# Patient Record
Sex: Female | Born: 1945 | Race: White | Hispanic: No | Marital: Married | Smoking: Never smoker
Health system: Southern US, Community
[De-identification: ages and names within clinical notes are randomized; demographics above are authoritative.]

## PROBLEM LIST (undated history)

## (undated) DIAGNOSIS — I10 Essential (primary) hypertension: Secondary | ICD-10-CM

## (undated) DIAGNOSIS — K219 Gastro-esophageal reflux disease without esophagitis: Secondary | ICD-10-CM

## (undated) DIAGNOSIS — G629 Polyneuropathy, unspecified: Secondary | ICD-10-CM

## (undated) DIAGNOSIS — E119 Type 2 diabetes mellitus without complications: Secondary | ICD-10-CM

## (undated) DIAGNOSIS — J8410 Pulmonary fibrosis, unspecified: Secondary | ICD-10-CM

## (undated) HISTORY — PX: CHOLECYSTECTOMY: SHX55

## (undated) HISTORY — PX: APPENDECTOMY: SHX54

## (undated) HISTORY — PX: LUNG BIOPSY: SHX232

## (undated) HISTORY — PX: TONSILLECTOMY: SUR1361

## (undated) HISTORY — PX: GASTRIC FUNDOPLICATION: SHX226

---

## 2001-04-02 ENCOUNTER — Ambulatory Visit (HOSPITAL_COMMUNITY): Admission: RE | Admit: 2001-04-02 | Discharge: 2001-04-02 | Payer: Self-pay | Admitting: Gastroenterology

## 2004-12-14 ENCOUNTER — Ambulatory Visit: Payer: Self-pay | Admitting: Gastroenterology

## 2004-12-16 ENCOUNTER — Ambulatory Visit (HOSPITAL_COMMUNITY): Admission: RE | Admit: 2004-12-16 | Discharge: 2004-12-16 | Payer: Self-pay | Admitting: Gastroenterology

## 2004-12-21 ENCOUNTER — Ambulatory Visit (HOSPITAL_COMMUNITY): Admission: RE | Admit: 2004-12-21 | Discharge: 2004-12-21 | Payer: Self-pay | Admitting: Gastroenterology

## 2004-12-27 ENCOUNTER — Ambulatory Visit: Payer: Self-pay | Admitting: Gastroenterology

## 2005-01-03 ENCOUNTER — Ambulatory Visit: Payer: Self-pay | Admitting: Gastroenterology

## 2005-08-04 IMAGING — RF DG UGI W/ KUB
19 of 24 series · 19 of 24 positions shown · non-contrast
Comparison: none

CLINICAL DATA: Abdominal pain and reflux.  The patient had remote gastric procedure which was reversed.
SINGLE-CONTRAST UPPER GI SERIES WITH KUB:
On the scout view, multiple surgical clips and staples are seen in the upper abdomen, left greater than right.  Prominent degenerative changes throughout the lumbar spine with disc space narrowing at L3-4 and L4-5.  
Initially, there was a normal primary esophageal stripping wave.  There is distortion of the gastric fundus region with small sliding-type hiatal hernia.  The distortion may be related to patient?s prior surgery without discrete ulceration separate from such.  Initially, reflux was not demonstrated.  However, when the patient changed position, there was significant spontaneous gastroesophageal reflux reaching the level of the upper thoracic esophagus.  It was somewhat difficult to obtain adequately distended views of the remainder of the stomach but without ulceration identified.  Duodenal bulb is of normal configuration without ulceration.  Proximally visualized small bowel unremarkable.

[Series 1: run · 1 of 9 slices shown (1 of 19)]
[im 1/9]
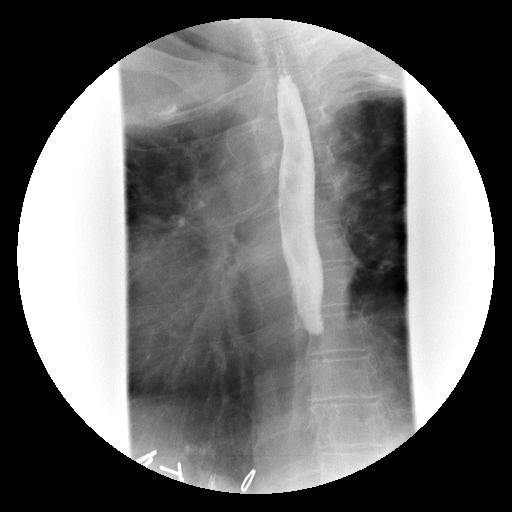

[Series 2: run · 1 of 9 slices shown (2 of 19)]
[im 1/9]
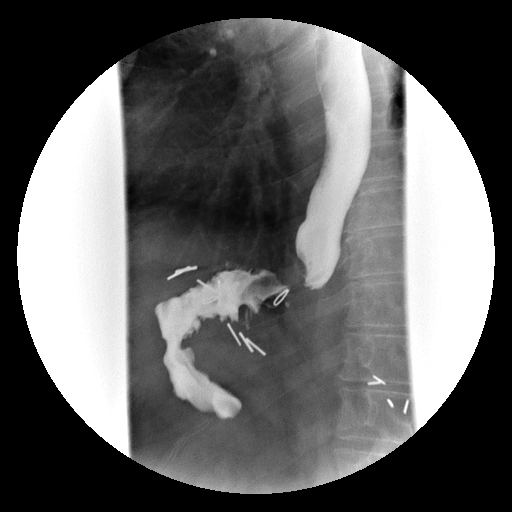

[Series 4: run · 1 of 1 slices shown (3 of 19)]
[im 1/1]
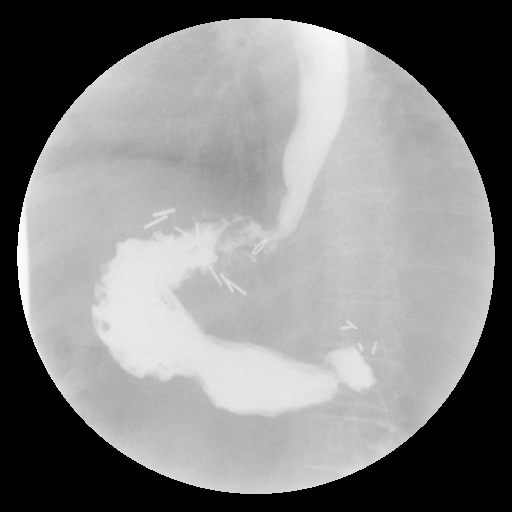

[Series 5: run · 1 of 1 slices shown (4 of 19)]
[im 1/1]
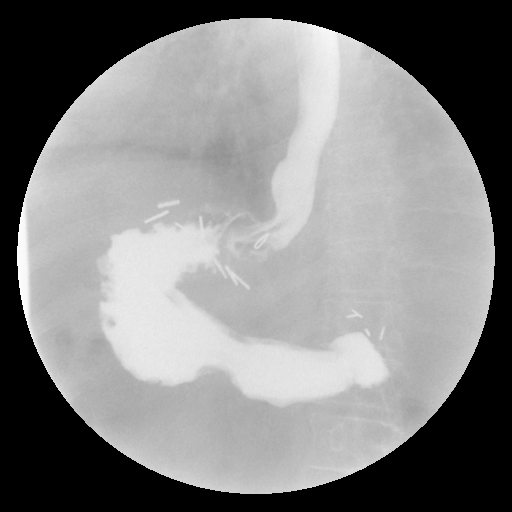

[Series 6: run · 1 of 1 slices shown (5 of 19)]
[im 1/1]
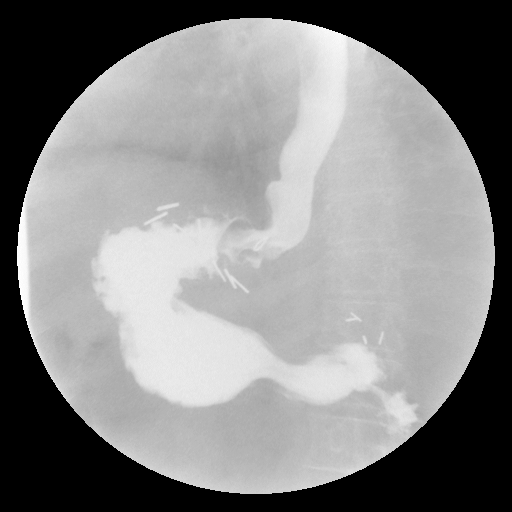

[Series 7: run · 1 of 1 slices shown (6 of 19)]
[im 1/1]
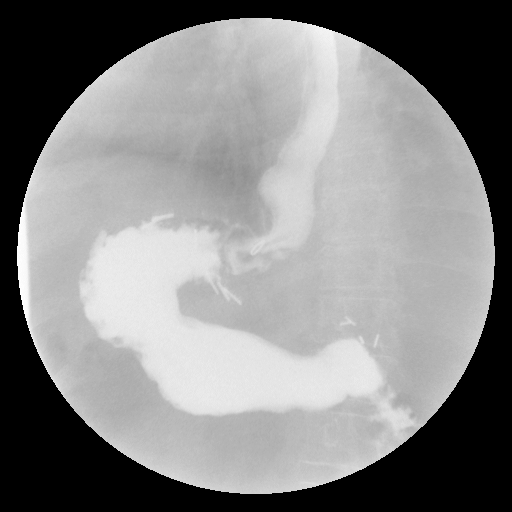

[Series 9: run · 1 of 1 slices shown (7 of 19)]
[im 1/1]
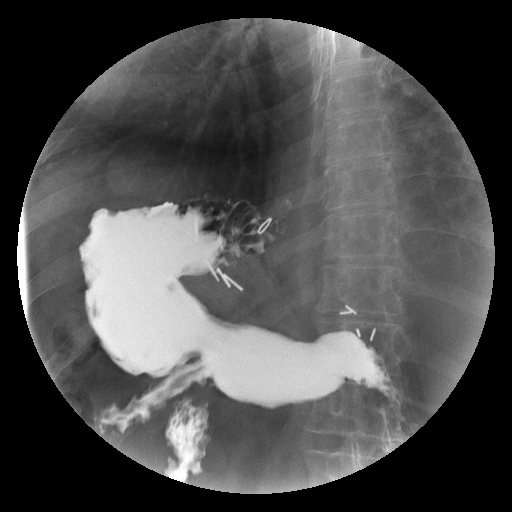

[Series 10: run · 1 of 1 slices shown (8 of 19)]
[im 1/1]
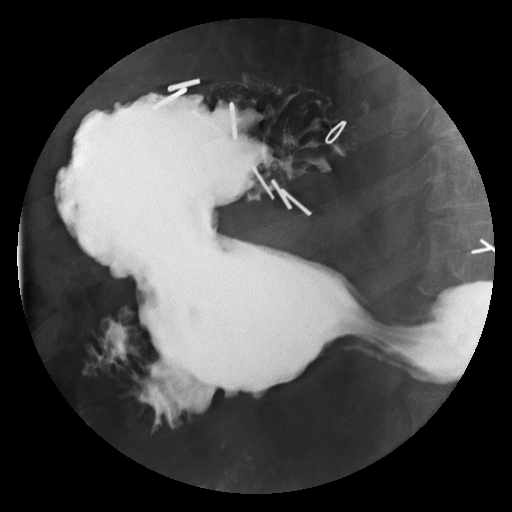

[Series 11: run · 1 of 1 slices shown (9 of 19)]
[im 1/1]
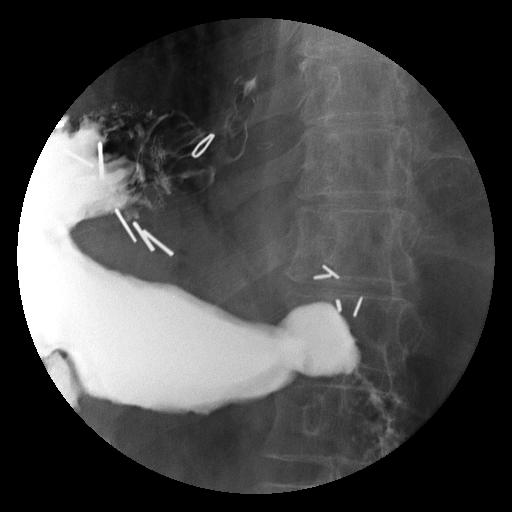

[Series 13: run · 1 of 3 slices shown (10 of 19)]
[im 1/3]
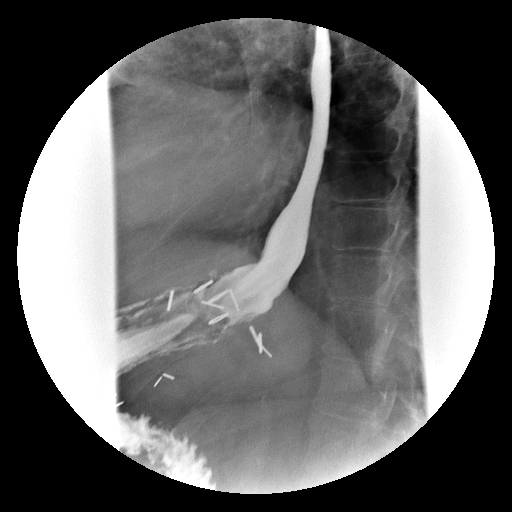

[Series 14: run · 1 of 1 slices shown (11 of 19)]
[im 1/1]
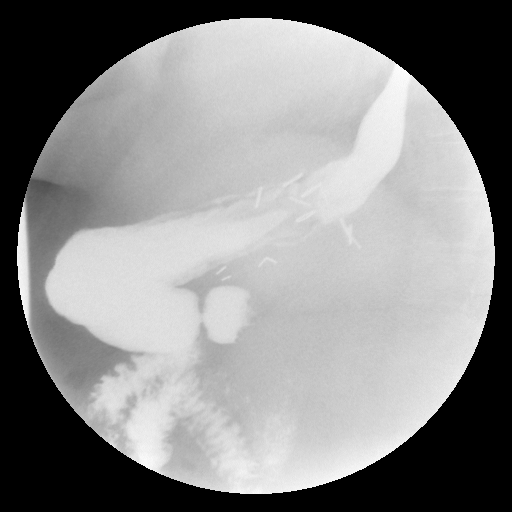

[Series 15: run · 1 of 1 slices shown (12 of 19)]
[im 1/1]
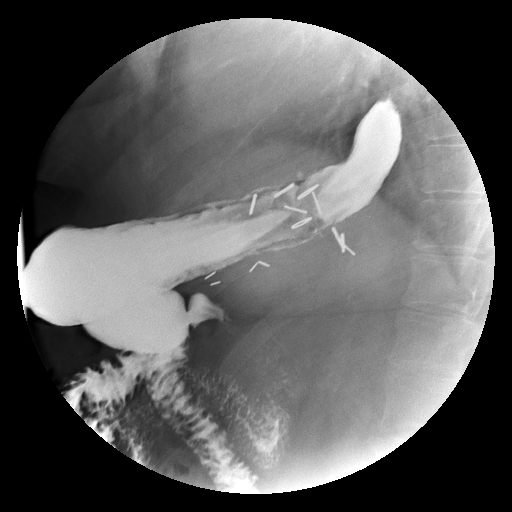

[Series 16: run · 1 of 1 slices shown (13 of 19)]
[im 1/1]
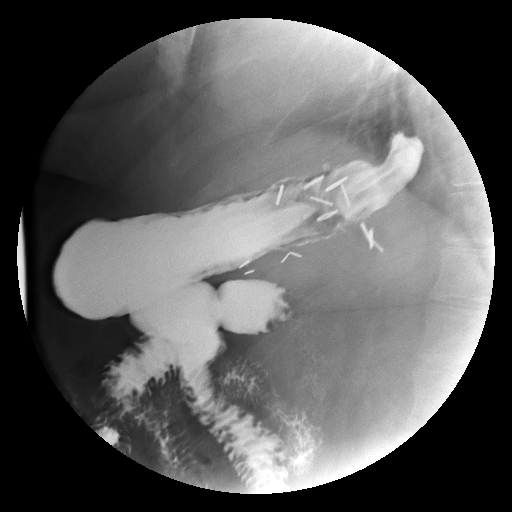

[Series 18: run · 1 of 1 slices shown (14 of 19)]
[im 1/1]
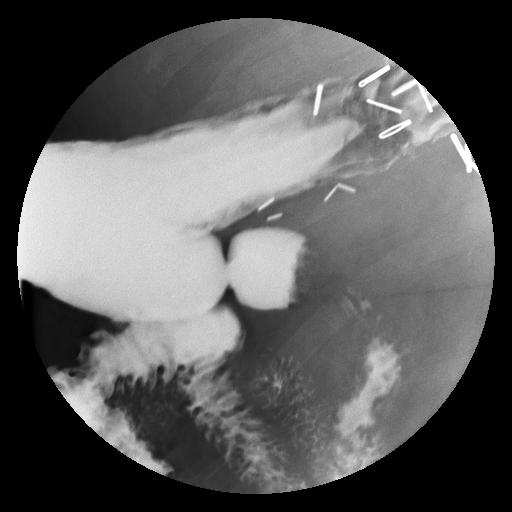

[Series 19: run · 1 of 1 slices shown (15 of 19)]
[im 1/1]
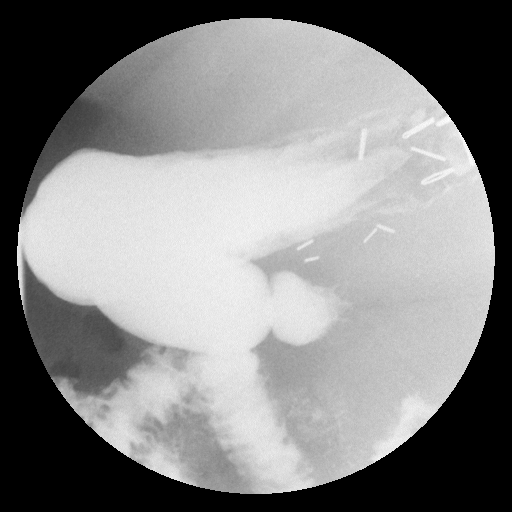

[Series 20: run · 1 of 1 slices shown (16 of 19)]
[im 1/1]
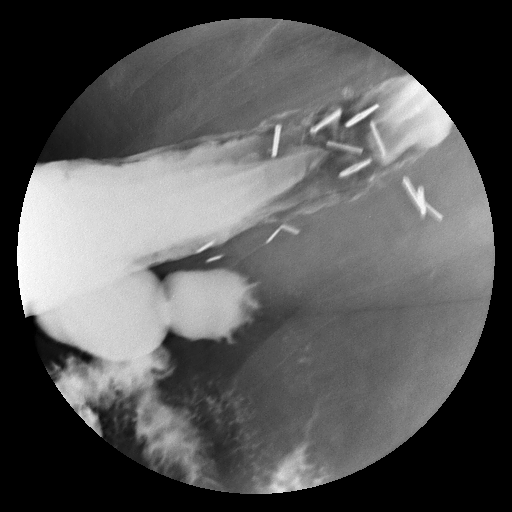

[Series 21: run · 1 of 1 slices shown (17 of 19)]
[im 1/1]
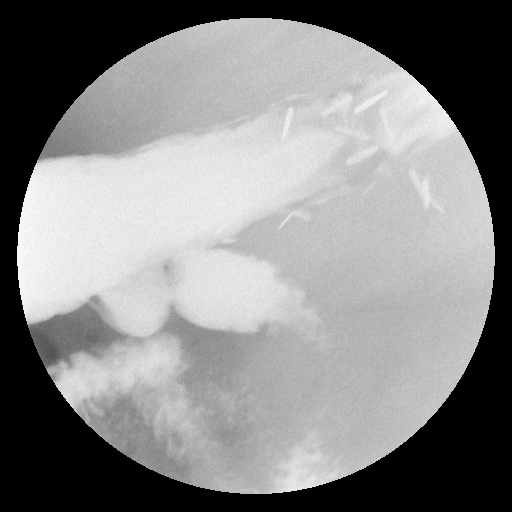

[Series 23: run · 1 of 1 slices shown (18 of 19)]
[im 1/1]
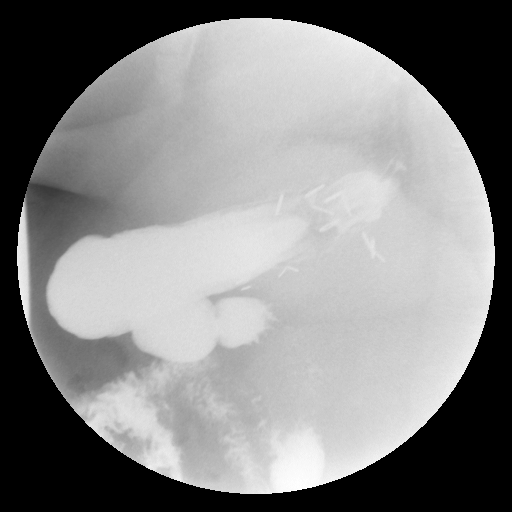

[Series 24: run · 1 of 1 slices shown (19 of 19)]
[im 1/1]
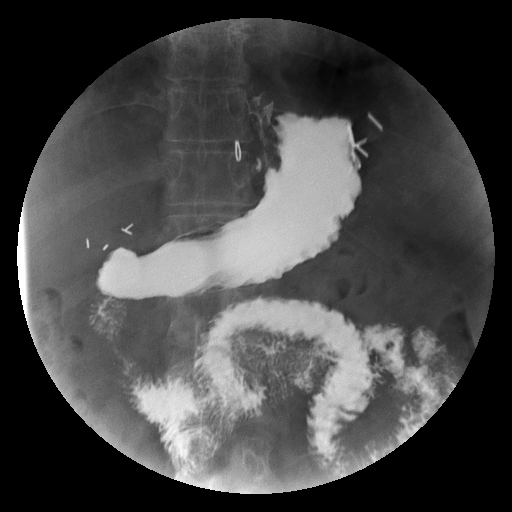

[19 of 24 positions shown; findings below may reference images not displayed]

IMPRESSION: Distortion of the gastric fundus may be related to result of prior surgery without definite ulcer separate from such.  If one needed to further evaluate this region, endoscopy would be necessary.  There is a residual small hiatal hernia and prominent spontaneous gastroesophageal reflux with change of patient position.  Please see above.

## 2006-08-07 ENCOUNTER — Ambulatory Visit: Payer: Self-pay | Admitting: Gastroenterology

## 2006-09-21 ENCOUNTER — Ambulatory Visit: Payer: Self-pay | Admitting: Gastroenterology

## 2013-10-28 ENCOUNTER — Encounter: Payer: Self-pay | Admitting: Emergency Medicine

## 2013-10-28 ENCOUNTER — Emergency Department (INDEPENDENT_AMBULATORY_CARE_PROVIDER_SITE_OTHER)
Admission: EM | Admit: 2013-10-28 | Discharge: 2013-10-28 | Disposition: A | Discharge status: Home / Self Care | Payer: Medicare Other | Source: Home / Self Care | Attending: Emergency Medicine | Admitting: Emergency Medicine

## 2013-10-28 DIAGNOSIS — R112 Nausea with vomiting, unspecified: Secondary | ICD-10-CM

## 2013-10-28 DIAGNOSIS — R197 Diarrhea, unspecified: Secondary | ICD-10-CM

## 2013-10-28 HISTORY — DX: Gastro-esophageal reflux disease without esophagitis: K21.9

## 2013-10-28 HISTORY — DX: Type 2 diabetes mellitus without complications: E11.9

## 2013-10-28 HISTORY — DX: Pulmonary fibrosis, unspecified: J84.10

## 2013-10-28 HISTORY — DX: Polyneuropathy, unspecified: G62.9

## 2013-10-28 HISTORY — DX: Essential (primary) hypertension: I10

## 2013-10-28 MED ORDER — PROMETHAZINE HCL 25 MG/ML IJ SOLN
25.0000 mg | INTRAMUSCULAR | Status: AC
  Administered 2013-10-28: 25 mg via INTRAMUSCULAR

## 2013-10-28 MED ORDER — ONDANSETRON 4 MG PO TBDP: 4.0000 mg | ORAL_TABLET | Freq: Three times a day (TID) | ORAL | Status: AC | PRN

## 2013-10-28 NOTE — ED Provider Notes (Signed)
CSN: [id number]     Arrival date & time 10/28/13  4098 History   First MD Initiated Contact with Patient 10/28/13 [phone]     Chief Complaint  Patient presents with  . Emesis  . Diarrhea     (Consider location/radiation/quality/duration/timing/severity/associated sxs/prior Treatment) Patient is a 68 y.o. female presenting with vomiting and diarrhea. The history is provided by the patient.  Emesis Severity:  Moderate Duration:  24 hours Timing:  Intermittent Emesis appearance: Clearish, minimally bile colored. Able to tolerate:  Liquids Progression:  Unchanged Chronicity:  New Recent urination:  Normal Context: not post-tussive and not self-induced   Relieved by:  Nothing Ineffective treatments:  None tried Associated symptoms: diarrhea (Several loose, watery diarrhea stools past 24 hours.--No blood in diarrhea. Possible mucous, she's not certain)   Associated symptoms: no abdominal pain, no chills, no cough, no fever (no definite fever, but she has not taken her temperature), no headaches, no myalgias, no sore throat and no URI   Risk factors: no sick contacts and no travel to endemic areas   Diarrhea Associated symptoms: vomiting   Associated symptoms: no abdominal pain, no chills, no recent cough, no headaches, no myalgias and no URI     Past Medical History  Diagnosis Date  . Hypertension   . Diabetes mellitus without complication   . Pulmonary fibrosis   . Neuropathy   . GERD (gastroesophageal reflux disease)    Past Surgical History  Procedure Laterality Date  . Gastric fundoplication    . Cholecystectomy    . Tonsillectomy    . Appendectomy    . Lung biopsy     Family History  Problem Relation Age of Onset  . Heart failure Mother   . Pulmonary fibrosis Mother   . Heart failure Brother   . Pulmonary fibrosis Brother    History  Substance Use Topics  . Smoking status: Never Smoker   . Smokeless tobacco: Not on file  . Alcohol Use: No   OB History   Grav  Para Term Preterm Abortions TAB SAB Ect Mult Living                 Review of Systems  Constitutional: Negative for chills.  HENT: Negative for sore throat.   Gastrointestinal: Positive for vomiting and diarrhea (Several loose, watery diarrhea stools past 24 hours.--No blood in diarrhea. Possible mucous, she's not certain). Negative for abdominal pain.  Musculoskeletal: Negative for myalgias.  Neurological: Negative for headaches.  All other systems reviewed and are negative.      Allergies  Demerol and Latex  Home Medications   Current Outpatient Rx  Name  Route  Sig  Dispense  Refill  . gabapentin (NEURONTIN) 100 MG capsule   Oral   Take 100 mg by mouth 3 (three) times daily.         Marland Kitchen lisinopril (PRINIVIL,ZESTRIL) 10 MG tablet   Oral   Take 10 mg by mouth daily.         . metoprolol succinate (TOPROL-XL) 25 MG 24 hr tablet   Oral   Take 25 mg by mouth daily.         Marland Kitchen omeprazole (PRILOSEC) 20 MG capsule   Oral   Take 20 mg by mouth daily.         Marland Kitchen PARoxetine (PAXIL) 20 MG tablet   Oral   Take 20 mg by mouth daily.         . ondansetron (ZOFRAN-ODT) 4 MG disintegrating tablet  Oral   Take 1 tablet (4 mg total) by mouth every 8 (eight) hours as needed for nausea.   15 tablet   0    BP 172/93  Pulse 81  Temp(Src) 98.3 F (36.8 C) (Oral)  Resp 18  Ht 5\' 2"  (1.575 m)  Wt 243 lb (110.224 kg)  BMI 44.43 kg/m2  SpO2 95% Physical Exam  Nursing note and vitals reviewed. Constitutional: She is oriented to person, place, and time. She appears well-developed and well-nourished.  Non-toxic appearance. No distress.  Appears fatigued, but no acute distress. Pleasant, cooperative.  HENT:  Head: Normocephalic and atraumatic.  Nose: Nose normal.  Mouth/Throat: Oropharynx is clear and moist.  Oropharynx clear. Some moisture on mucous membranes. No lesions.  Eyes: Pupils are equal, round, and reactive to light. No scleral icterus.  Neck: Normal range of  motion. Neck supple. No JVD present.  Cardiovascular: Normal rate, regular rhythm and normal heart sounds.   No murmur heard. Pulmonary/Chest: Effort normal and breath sounds normal.  Abdominal: Soft. She exhibits no distension, no abdominal bruit and no mass. Bowel sounds are increased. There is no hepatosplenomegaly. There is tenderness (Minimal nonspecific, diffuse tenderness). There is no rebound, no guarding and no CVA tenderness.  Musculoskeletal: Normal range of motion. She exhibits no edema and no tenderness.  Lymphadenopathy:    She has no cervical adenopathy.  Neurological: She is alert and oriented to person, place, and time.  Skin: No rash noted.  Psychiatric: She has a normal mood and affect.    ED Course  Procedures (including critical care time) Labs Review Labs Reviewed - No data to display Imaging Review No results found.    MDM   Final diagnoses:  Nausea, vomiting and diarrhea    We discussed diagnosis and workup options. She declined any testing. She declined CBC. Clinically, there is no sign of any acute abdomen. She has increased bowel sounds. And diarrhea, which is not consistent with any acute surgical process. Treatment options discussed, as well as risks, benefits, alternatives. Patient voiced understanding and agreement with the following plans: Phenergan 25 mg IM stat. She was observed for 20 minutes and tolerated this well and the nausea significantly improved. Zofran ODT prescribed when necessary nausea. Push fluids and other symptomatic care discussed Follow-up with your primary care doctor in 5-7 days if not improving, or sooner if symptoms become worse.  Precautions discussed. Red flags discussed. Questions invited and answered. Patient voiced understanding and agreement. Her husband drove her home     Lajean Manesavid Massey, MD 10/28/13 [phone]

## 2013-10-28 NOTE — ED Notes (Signed)
Pt c/o vomiting and diarrhea x last night. Denies fever or abd pain.

## 2017-12-17 DEATH — deceased
